# Patient Record
Sex: Female | Born: 1990 | Race: White | Hispanic: No | Marital: Single | Smoking: Former smoker
Health system: Southern US, Community
[De-identification: ages and names within clinical notes are randomized; demographics above are authoritative.]

## PROBLEM LIST (undated history)

## (undated) DIAGNOSIS — G40909 Epilepsy, unspecified, not intractable, without status epilepticus: Secondary | ICD-10-CM

## (undated) HISTORY — PX: ADENOIDECTOMY: SUR15

---

## 2016-05-06 ENCOUNTER — Ambulatory Visit
Admission: EM | Admit: 2016-05-06 | Discharge: 2016-05-06 | Disposition: A | Discharge status: Home / Self Care | Payer: 59 | Attending: Family Medicine | Admitting: Family Medicine

## 2016-05-06 ENCOUNTER — Encounter: Payer: Self-pay | Admitting: Emergency Medicine

## 2016-05-06 DIAGNOSIS — R079 Chest pain, unspecified: Secondary | ICD-10-CM | POA: Diagnosis present

## 2016-05-06 DIAGNOSIS — Z79899 Other long term (current) drug therapy: Secondary | ICD-10-CM | POA: Diagnosis not present

## 2016-05-06 DIAGNOSIS — M940 Chondrocostal junction syndrome [Tietze]: Secondary | ICD-10-CM

## 2016-05-06 DIAGNOSIS — R0789 Other chest pain: Secondary | ICD-10-CM | POA: Diagnosis not present

## 2016-05-06 HISTORY — DX: Epilepsy, unspecified, not intractable, without status epilepticus: G40.909

## 2016-05-06 MED ORDER — CYCLOBENZAPRINE HCL 10 MG PO TABS: ORAL_TABLET | ORAL | 0 refills | Status: DC

## 2016-05-06 NOTE — ED Provider Notes (Signed)
[facility]    CSN: [id number] Arrival date & time: 05/06/16  1750     History   Chief Complaint Chief Complaint  Patient presents with  . Chest Pain    HPI Jocelyn Reed is a 25 y.o. female.   25 yo female a c/o dull and sharp chest pains to the left upper chest, worse with position changes and occasionally with deep breaths. Denies any shortness of breath, cough, fevers, chills. Otherwise healthy. States last year she had a work up by a cardiologist in [place] and tests were normal.    The history is provided by the patient.  Chest Pain    Past Medical History:  Diagnosis Date  . Epilepsy (HCC)     There are no active problems to display for this patient.   Past Surgical History:  Procedure Laterality Date  . ADENOIDECTOMY      OB History    No data available       Home Medications    Prior to Admission medications   Medication Sig Start Date End Date Taking? Authorizing Provider  levonorgestrel (MIRENA) 20 MCG/24HR IUD 1 each by Intrauterine route once.   Yes Historical Provider, MD  cyclobenzaprine (FLEXERIL) 10 MG tablet 1 tab po qhs prn 05/06/16   Payton Mccallumrlando Adamarys Shall, MD    Family History History reviewed. No pertinent family history.  Social History Social History  Substance Use Topics  . Smoking status: Never Smoker  . Smokeless tobacco: Never Used  . Alcohol use Yes     Allergies   Patient has no known allergies.   Review of Systems Review of Systems  Cardiovascular: Positive for chest pain.     Physical Exam Triage Vital Signs ED Triage Vitals  Enc Vitals Group     BP 05/06/16 1807 102/64     Pulse Rate 05/06/16 1807 81     Resp 05/06/16 1807 16     Temp 05/06/16 1807 97.2 F (36.2 C)     Temp Source 05/06/16 1807 Tympanic     SpO2 05/06/16 1807 100 %     Weight 05/06/16 1804 120 lb (54.4 kg)     Height 05/06/16 1804 5\' 2"  (1.575 m)     Head Circumference --      Peak Flow --      Pain Score 05/06/16 1806 5   Pain Loc --      Pain Edu? --      Excl. in GC? --    No data found.   Updated Vital Signs BP 102/64 (BP Location: Left Arm)   Pulse 81   Temp 97.2 F (36.2 C) (Tympanic)   Resp 16   Ht 5\' 2"  (1.575 m)   Wt 120 lb (54.4 kg)   SpO2 100%   BMI 21.95 kg/m   Visual Acuity Right Eye Distance:   Left Eye Distance:   Bilateral Distance:    Right Eye Near:   Left Eye Near:    Bilateral Near:     Physical Exam  Constitutional: She appears well-developed and well-nourished. No distress.  HENT:  Head: Normocephalic and atraumatic.  Right Ear: Tympanic membrane, external ear and ear canal normal.  Left Ear: Tympanic membrane, external ear and ear canal normal.  Nose: No mucosal edema, rhinorrhea, nose lacerations, sinus tenderness, nasal deformity, septal deviation or nasal septal hematoma. No epistaxis.  No foreign bodies. Right sinus exhibits no maxillary sinus tenderness and no frontal sinus tenderness. Left sinus exhibits no maxillary sinus  tenderness and no frontal sinus tenderness.  Mouth/Throat: Uvula is midline, oropharynx is clear and moist and mucous membranes are normal. No oropharyngeal exudate.  Eyes: Conjunctivae and EOM are normal. Pupils are equal, round, and reactive to light. Right eye exhibits no discharge. Left eye exhibits no discharge. No scleral icterus.  Neck: Normal range of motion. Neck supple. No thyromegaly present.  Cardiovascular: Normal rate, regular rhythm and normal heart sounds.   Pulmonary/Chest: Effort normal and breath sounds normal. No respiratory distress. She has no wheezes. She has no rales. She exhibits tenderness (reproducible left upper chest ).  Lymphadenopathy:    She has no cervical adenopathy.  Skin: She is not diaphoretic.  Nursing note and vitals reviewed.    UC Treatments / Results  Labs (all labs ordered are listed, but only abnormal results are displayed) Labs Reviewed - No data to display  EKG  EKG Interpretation None         Radiology No results found.  Procedures .EKG Date/Time: 05/06/2016 7:38 PM Performed by: Payton MccallumONTY, Jabre Heo Authorized by: Payton MccallumONTY, Seann Genther   ECG reviewed by ED Physician in the absence of a cardiologist: yes   Previous ECG:    Previous ECG:  Unavailable Interpretation:    Interpretation: normal   Rate:    ECG rate assessment: normal   Rhythm:    Rhythm: sinus rhythm   Ectopy:    Ectopy: none   QRS:    QRS axis:  Normal Conduction:    Conduction: normal   ST segments:    ST segments:  Normal T waves:    T waves: normal     (including critical care time)  Medications Ordered in UC Medications - No data to display   Initial Impression / Assessment and Plan / UC Course  I have reviewed the triage vital signs and the nursing notes.  Pertinent labs & imaging results that were available during my care of the patient were reviewed by me and considered in my medical decision making (see chart for details).  Clinical Course       Final Clinical Impressions(s) / UC Diagnoses   Final diagnoses:  Chest wall pain  Costochondritis, acute    New Prescriptions Discharge Medication List as of 05/06/2016  7:34 PM    START taking these medications   Details  cyclobenzaprine (FLEXERIL) 10 MG tablet 1 tab po qhs prn, Normal        1. ekg results and diagnosis reviewed with patient 2. rx as per orders above; reviewed possible side effects, interactions, risks and benefits  3. Recommend supportive treatment with otc analgesics prn, heat/ice 4. Follow-up prn if symptoms worsen or don't improve   Payton Mccallumrlando Joshlynn Alfonzo, MD 05/06/16 1941

## 2016-05-06 NOTE — ED Triage Notes (Signed)
Patient was seen by a Cardiologist last year for chest pain.  Patient states that she lost her insurance and never went back to follow-up with her Cardiologist.  Patient c/o chest pain that started today.  Patient reports SOB.

## 2016-05-09 ENCOUNTER — Telehealth: Payer: Self-pay

## 2016-05-09 NOTE — Telephone Encounter (Signed)
Courtesy call back completed today for patients recent visit at [facility]. Patient did not answer, left message on voicemail to call back with any questions or concerns.   

## 2016-06-24 ENCOUNTER — Ambulatory Visit
Admission: EM | Admit: 2016-06-24 | Discharge: 2016-06-24 | Disposition: A | Discharge status: Home / Self Care | Payer: 59 | Attending: Family Medicine | Admitting: Family Medicine

## 2016-06-24 ENCOUNTER — Encounter: Payer: Self-pay | Admitting: Emergency Medicine

## 2016-06-24 DIAGNOSIS — R071 Chest pain on breathing: Secondary | ICD-10-CM

## 2016-06-24 DIAGNOSIS — R0789 Other chest pain: Secondary | ICD-10-CM

## 2016-06-24 MED ORDER — NAPROXEN 500 MG PO TABS: 500.0000 mg | ORAL_TABLET | Freq: Two times a day (BID) | ORAL | 0 refills | Status: AC

## 2016-06-24 MED ORDER — TIZANIDINE HCL 4 MG PO CAPS: 4.0000 mg | ORAL_CAPSULE | Freq: Three times a day (TID) | ORAL | 0 refills | Status: AC

## 2016-06-24 NOTE — ED Triage Notes (Signed)
Patient c/o chest pain that started 4 days ago.  Patient states it feels that same as when she was last here in December.  Patient denies SOB.  Patient reports increase in pain when she moves or lies down.

## 2016-06-24 NOTE — ED Provider Notes (Signed)
CSN: [id number]     Arrival date & time 06/24/16  1942 History   None    Chief Complaint  Patient presents with  . Chest Pain   (Consider location/radiation/quality/duration/timing/severity/associated sxs/prior Treatment) HPI  This a 26 year old female presents with recurrent anterior chest wall pain on the left. Is been seen here before by Dr. Izell Carolinaonti who diagnosed her with costochondritis scribed Flexeril which helped her pain. She states that she had to take 2 in order for it to work properly. To rule out any heart abnormalities she was seen by cardiologist who assured her that it was not her heart. She describes wearing a Holter monitor as well as echocardiogram. 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 He states that the pain will come and go and occur at different times and activities and has no real rhyme or reason.      Past Medical History:  Diagnosis Date  . Epilepsy [facility](HCC)    Past Surgical History:  Procedure Laterality Date  . ADENOIDECTOMY     History reviewed. No pertinent family history. Social History  Substance Use Topics  . Smoking status: Never Smoker  . Smokeless tobacco: Never Used  . Alcohol use Yes   OB History    No data available     Review of Systems  Constitutional: Positive for activity change. Negative for chills, fatigue and fever.  Cardiovascular: Positive for chest pain.  Musculoskeletal: Positive for myalgias.  All other systems reviewed and are negative.   Allergies  Patient has no known allergies.  Home Medications   Prior to Admission medications   Medication Sig Start Date End Date Taking? Authorizing Provider  levonorgestrel (MIRENA) 20 MCG/24HR IUD 1 each by Intrauterine route once.    Historical Provider, MD  naproxen (NAPROSYN) 500 MG tablet Take 1 tablet (500 mg total) by mouth 2 (two) times daily  with a meal. 06/24/16   Lutricia FeilWilliam P Xaidyn Kepner, PA-C  tiZANidine (ZANAFLEX) 4 MG capsule Take 1 capsule (4 mg total) by mouth 3 (three) times daily. 06/24/16   Lutricia FeilWilliam P Nigel Ericsson, PA-C   Meds Ordered and Administered this Visit  Medications - No data to display  BP 101/73 (BP Location: Left Arm)   Pulse 77   Temp 98.2 F (36.8 C) (Oral)   Resp 16   Ht 5\' 3"  (1.6 m)   Wt 125 lb (56.7 kg)   SpO2 98%   BMI 22.14 kg/m  No data found.   Physical Exam  Constitutional: She appears well-developed and well-nourished. No distress.  HENT:  Head: Normocephalic and atraumatic.  Eyes: EOM are normal. Pupils are equal, round, and reactive to light. Right eye exhibits no discharge. Left eye exhibits no discharge.  Neck: Normal range of motion. Neck supple.  Cardiovascular: Normal rate, regular rhythm and normal heart sounds.  Exam reveals no gallop and no friction rub.   No murmur heard. Pulmonary/Chest: Effort normal and breath sounds normal. No respiratory distress. She has no wheezes. She has no rales. She exhibits tenderness.  Examination was performed in presence of Heather, RN as chaperone. She has some mild tenderness over the costochondral junctions 2 through 4 as well as over the mid axillary line which is referred to the anterior costochondral junctions. Is no pain with compression of the sternum.  Skin: She is not diaphoretic.  Nursing  note and vitals reviewed.   Urgent Care Course     Procedures (including critical care time)  Labs Review Labs Reviewed - No data to display  Imaging Review No results found.   Visual Acuity Review  Right Eye Distance:   Left Eye Distance:   Bilateral Distance:    Right Eye Near:   Left Eye Near:    Bilateral Near:         MDM   1. Costochondral chest pain    Discharge Medication List as of 06/24/2016  9:20 PM    START taking these medications   Details  naproxen (NAPROSYN) 500 MG tablet Take 1 tablet (500 mg total) by mouth 2 (two)  times daily with a meal., Starting Fri 06/24/2016, Normal    tiZANidine (ZANAFLEX) 4 MG capsule Take 1 capsule (4 mg total) by mouth 3 (three) times daily., Starting Fri 06/24/2016, Normal      Plan: 1. Test/x-ray results and diagnosis reviewed with patient 2. rx as per orders; risks, benefits, potential side effects reviewed with patient 3. Recommend supportive treatment with Symptom Avoidance and rest as necessary. Will prescribe Naprosyn for anti-inflammatory effect and asked her to use it continuously for 1 week and take with meals. Will  switch her to Zanaflex. She was cautioned regarding performing activities that require concentration and judgment and not driving while using the medication. she does have a an appointment with Dr. Judithann Graves her new primary care physician who she will discuss our visit today. 4. F/u prn if symptoms worsen or don't improve     Lutricia Feil, PA-C 06/24/16 2129

## 2016-07-05 ENCOUNTER — Ambulatory Visit
Admission: RE | Admit: 2016-07-05 | Discharge: 2016-07-05 | Disposition: A | Discharge status: Home / Self Care | Payer: 59 | Source: Ambulatory Visit | Attending: Internal Medicine | Admitting: Internal Medicine

## 2016-07-05 ENCOUNTER — Ambulatory Visit (INDEPENDENT_AMBULATORY_CARE_PROVIDER_SITE_OTHER): Payer: 59 | Admitting: Internal Medicine

## 2016-07-05 ENCOUNTER — Encounter: Payer: Self-pay | Admitting: Internal Medicine

## 2016-07-05 VITALS — BP 118/78 | HR 82 | Temp 97.6°F | Ht 63.0 in | Wt 123.0 lb

## 2016-07-05 DIAGNOSIS — R071 Chest pain on breathing: Secondary | ICD-10-CM

## 2016-07-05 DIAGNOSIS — R0789 Other chest pain: Secondary | ICD-10-CM

## 2016-07-05 DIAGNOSIS — B360 Pityriasis versicolor: Secondary | ICD-10-CM

## 2016-07-05 DIAGNOSIS — N399 Disorder of urinary system, unspecified: Secondary | ICD-10-CM | POA: Diagnosis not present

## 2016-07-05 LAB — POCT URINALYSIS DIPSTICK
BILIRUBIN UA: NEGATIVE
Blood, UA: NEGATIVE
GLUCOSE UA: NEGATIVE
KETONES UA: NEGATIVE
LEUKOCYTES UA: NEGATIVE
NITRITE UA: NEGATIVE
PH UA: 5
Protein, UA: NEGATIVE
Spec Grav, UA: 1.01
Urobilinogen, UA: 0.2

## 2016-07-05 MED ORDER — CELECOXIB 200 MG PO CAPS: 200.0000 mg | ORAL_CAPSULE | Freq: Two times a day (BID) | ORAL | 0 refills | Status: AC

## 2016-07-05 MED ORDER — KETOCONAZOLE 2 % EX CREA: 1.0000 "application " | TOPICAL_CREAM | Freq: Every day | CUTANEOUS | 0 refills | Status: AC

## 2016-07-05 NOTE — Progress Notes (Signed)
Date:  07/05/2016   Name:  Jocelyn Reed   DOB:  Apr 06, 1991   MRN:  [id number]   Chief Complaint: Establish Care; Chest Pain (Pt stated having pain lt side under her breast 3 years); and Fatigue Chest wall pain - seen twice in UC for this in the past month.  Prescribed nsaids and muscle relaxants.  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  Chest Pain   This is a chronic problem. The current episode started more than 1 year ago (three years ago). The problem occurs daily. The pain is present in the lateral region. The pain is mild. The quality of the pain is described as stabbing. The pain does not radiate. Pertinent negatives include no abdominal pain, back pain, cough, dizziness, fever, headaches, nausea, palpitations or shortness of breath. The pain is aggravated by nothing. She has tried NSAIDs (aleve and advil no help) for the symptoms. The treatment provided no relief. Prior workup: EKG and Monitor.  Rash  This is a new problem. The current episode started more than 1 year ago. The problem has been gradually worsening since onset. The affected locations include the abdomen. The rash is characterized by redness. Pertinent negatives include no cough, diarrhea, fatigue, fever or shortness of breath. Past treatments include nothing.  Urinary Tract Infection   This is a recurrent problem. The problem occurs every urination. Quality: no pain, only odor. The patient is experiencing no pain. Associated symptoms include chills and frequency (and urine odor). Pertinent negatives include no nausea.     Review of Systems  Constitutional: Positive for chills. Negative for fatigue and fever.  Eyes: Negative for visual disturbance.  Respiratory: Negative for apnea, cough, choking, shortness of breath and wheezing.   Cardiovascular: Positive for chest pain. Negative for palpitations and leg swelling.  Gastrointestinal: Negative for abdominal pain, diarrhea and  nausea.  Endocrine: Negative for polydipsia and polyuria.  Genitourinary: Positive for frequency (and urine odor). Negative for dysuria, menstrual problem and vaginal discharge.  Musculoskeletal: Negative for arthralgias and back pain.  Skin: Positive for rash.  Neurological: Negative for dizziness and headaches.  Hematological: Negative for adenopathy.  Psychiatric/Behavioral: Negative for sleep disturbance.    Patient Active Problem List   Diagnosis Date Noted  . Costochondral chest pain 07/05/2016    Prior to Admission medications   Medication Sig Start Date End Date Taking? Authorizing Provider  levonorgestrel (MIRENA) 20 MCG/24HR IUD 1 each by Intrauterine route once.   Yes Historical Provider, MD  naproxen (NAPROSYN) 500 MG tablet Take 1 tablet (500 mg total) by mouth 2 (two) times daily with a meal. 06/24/16  Yes Lutricia Feil, PA-C  tiZANidine (ZANAFLEX) 4 MG capsule Take 1 capsule (4 mg total) by mouth 3 (three) times daily. 06/24/16  Yes Lutricia Feil, PA-C    No Known Allergies  Past Surgical History:  Procedure Laterality Date  . ADENOIDECTOMY      Social History  Substance Use Topics  . Smoking status: Former Smoker    Packs/day: 0.25    Years: 5.00    Types: Cigarettes    Quit date: 2016  . Smokeless tobacco: Never Used  . Alcohol use Yes     Medication list has been reviewed and updated.   Physical Exam  Constitutional: She is oriented to person, place, and time. She appears well-developed and well-nourished. No distress.  HENT:  Head: Normocephalic and atraumatic.  Neck: Normal range of  motion. Neck supple.  Cardiovascular: Normal rate, regular rhythm and normal heart sounds.   Pulmonary/Chest: Effort normal and breath sounds normal. No respiratory distress. She has no decreased breath sounds. She has no wheezes. She has no rhonchi. She has no rales. Tenderness: along lateral left T4-8.  Abdominal: Soft. Bowel sounds are normal. She exhibits no  distension. There is no tenderness. There is no CVA tenderness.  Musculoskeletal: Normal range of motion. She exhibits no edema.  Neurological: She is alert and oriented to person, place, and time.  Skin: Skin is warm and dry. Rash noted. Rash is macular (scattered lesions over mid abdomen and thighs).  Psychiatric: She has a normal mood and affect. Her behavior is normal. Thought content normal.  Nursing note and vitals reviewed.   BP 118/78   Pulse 82   Temp 97.6 F (36.4 C)   Ht 5\' 3"  (1.6 m)   Wt 123 lb (55.8 kg)   SpO2 98%   BMI 21.79 kg/m   Assessment and Plan: 1. Costochondral chest pain Will try celebrex and get CXR since ex-smoker - DG Chest 2 View; Future - celecoxib (CELEBREX) 200 MG capsule; Take 1 capsule (200 mg total) by mouth 2 (two) times daily.  Dispense: 60 capsule; Refill: 0  2. Urinary disorder UA negative - POCT urinalysis dipstick  3. Tinea versicolor - ketoconazole (NIZORAL) 2 % cream; Apply 1 application topically daily. For 2 weeks  Dispense: 15 g; Refill: 0   Bari EdwardLaura Cece Milhouse, MD [facility]  07/05/2016

## 2016-08-04 ENCOUNTER — Encounter: Payer: Self-pay | Admitting: Internal Medicine

## 2016-08-04 ENCOUNTER — Ambulatory Visit (INDEPENDENT_AMBULATORY_CARE_PROVIDER_SITE_OTHER): Payer: 59 | Admitting: Internal Medicine

## 2016-08-04 VITALS — BP 104/60 | HR 72 | Temp 98.3°F | Ht 63.0 in | Wt 125.0 lb

## 2016-08-04 DIAGNOSIS — H6692 Otitis media, unspecified, left ear: Secondary | ICD-10-CM

## 2016-08-04 DIAGNOSIS — J029 Acute pharyngitis, unspecified: Secondary | ICD-10-CM

## 2016-08-04 MED ORDER — AZITHROMYCIN 250 MG PO TABS: ORAL_TABLET | ORAL | 0 refills | Status: AC

## 2016-08-04 NOTE — Patient Instructions (Signed)

## 2016-08-04 NOTE — Progress Notes (Signed)
Date:  08/04/2016   Name:  Jocelyn KohlerLegacy Reed   DOB:  1990/06/27   MRN:  [id number]   Chief Complaint: Sore Throat (Difficulty swallowing. Woke up and Rt ear is hurting too. X 2 days. ) Sore Throat   This is a new problem. The current episode started yesterday. The problem has been gradually worsening. Neither side of throat is experiencing more pain than the other. There has been no fever. Associated symptoms include ear pain, neck pain and swollen glands. Pertinent negatives include no coughing, diarrhea, ear discharge, headaches, stridor or trouble swallowing. She has had no exposure to strep or mono. She has tried nothing for the symptoms.    Review of Systems  Constitutional: Negative for chills, fatigue and fever.  HENT: Positive for ear pain and sore throat. Negative for ear discharge, sinus pain, sinus pressure and trouble swallowing.   Respiratory: Negative for cough, chest tightness, wheezing and stridor.   Cardiovascular: Negative for chest pain, palpitations and leg swelling.  Gastrointestinal: Negative for diarrhea.  Musculoskeletal: Positive for neck pain.  Neurological: Negative for dizziness and headaches.    Patient Active Problem List   Diagnosis Date Noted  . Costochondral chest pain 07/05/2016    Prior to Admission medications   Medication Sig Start Date End Date Taking? Authorizing Provider  celecoxib (CELEBREX) 200 MG capsule Take 1 capsule (200 mg total) by mouth 2 (two) times daily. 07/05/16  Yes Reubin MilanLaura H Sena Hoopingarner, MD  ketoconazole (NIZORAL) 2 % cream Apply 1 application topically daily. For 2 weeks 07/05/16  Yes Reubin MilanLaura H Lenice Koper, MD  levonorgestrel [facility](MIRENA) 20 MCG/24HR IUD 1 each by Intrauterine route once.   Yes Historical Provider, MD  tiZANidine (ZANAFLEX) 4 MG capsule Take 1 capsule (4 mg total) by mouth 3 (three) times daily. 06/24/16  Yes Lutricia FeilWilliam P Roemer, PA-C  naproxen (NAPROSYN) 500 MG tablet Take 1 tablet (500 mg total) by mouth 2 (two) times daily with a  meal. Patient not taking: Reported on 08/04/2016 06/24/16   Lutricia FeilWilliam P Roemer, PA-C    No Known Allergies  Past Surgical History:  Procedure Laterality Date  . ADENOIDECTOMY      Social History  Substance Use Topics  . Smoking status: Former Smoker    Packs/day: 0.25    Years: 5.00    Types: Cigarettes    Quit date: 2016  . Smokeless tobacco: Never Used  . Alcohol use Yes     Medication list has been reviewed and updated.   Physical Exam  Constitutional: She is oriented to person, place, and time. She appears well-developed. No distress.  HENT:  Head: Normocephalic and atraumatic.  Right Ear: Tympanic membrane is perforated.  Left Ear: Tympanic membrane is perforated, erythematous and retracted.  Nose: Right sinus exhibits no maxillary sinus tenderness and no frontal sinus tenderness. Left sinus exhibits no maxillary sinus tenderness and no frontal sinus tenderness.  Mouth/Throat: Posterior oropharyngeal erythema present.  Tonsils very enlarged and erythematous  Neck: Normal range of motion.  Cardiovascular: Normal rate, regular rhythm and normal heart sounds.   Pulmonary/Chest: Effort normal and breath sounds normal. No respiratory distress. She has no wheezes.  Musculoskeletal: Normal range of motion.  Neurological: She is alert and oriented to person, place, and time.  Skin: Skin is warm and dry. No rash noted.  Psychiatric: She has a normal mood and affect. Her behavior is normal. Thought content normal.  Nursing note and vitals reviewed.   BP 104/60   Pulse 72  Temp 98.3 F (36.8 C)   Ht 5\' 3"  (1.6 m)   Wt 125 lb (56.7 kg)   LMP 07/12/2016 (Within Weeks)   SpO2 97%   BMI 22.14 kg/m   Assessment and Plan: 1. Pharyngitis, unspecified etiology Advil or Tylenol every 6-8 hours as needed for pain  2. Otitis of left ear Avoid over the counter ear drops of H2O2   Meds ordered this encounter  Medications  . azithromycin (ZITHROMAX Z-PAK) 250 MG tablet     Sig: UAD    Dispense:  6 each    Refill:  0    Bari Edward, MD [facility]  08/04/2016

## 2016-09-01 ENCOUNTER — Encounter: Payer: Self-pay | Admitting: Internal Medicine

## 2016-09-01 ENCOUNTER — Other Ambulatory Visit: Payer: Self-pay | Admitting: Internal Medicine

## 2016-09-01 ENCOUNTER — Telehealth: Payer: Self-pay

## 2016-09-01 DIAGNOSIS — J4520 Mild intermittent asthma, uncomplicated: Secondary | ICD-10-CM | POA: Insufficient documentation

## 2016-09-01 MED ORDER — ALBUTEROL SULFATE HFA 108 (90 BASE) MCG/ACT IN AERS: 2.0000 | INHALATION_SPRAY | Freq: Four times a day (QID) | RESPIRATORY_TRACT | 0 refills | Status: AC | PRN

## 2016-09-01 NOTE — Telephone Encounter (Signed)
Pt calling requesting for inhaler to be sent into pharmacy for wheezing and SOB while playing softball. Told she was diagnosed with asthma when she was younger and is having difficulty while running during softball to catch her breathe. She didn't want appt for this because of $20 co-pay. Advise?

## 2016-09-01 NOTE — Telephone Encounter (Signed)
Albuterol MDI sent to pharmacy.  If she continues to have sx despite the inhaler, she will need to be seen.

## 2016-09-05 NOTE — Telephone Encounter (Signed)
Tried contacting pt to inform- pt " mailbox is full and cannot accept messages at this time."

## 2017-09-30 IMAGING — CR DG CHEST 2V
2 series · 2 of 2 positions shown · non-contrast
Comparison: None.

CLINICAL DATA: Chronic left lateral chest wall pain. Initial
encounter.

EXAM:
CHEST  2 VIEW

[chest pa]
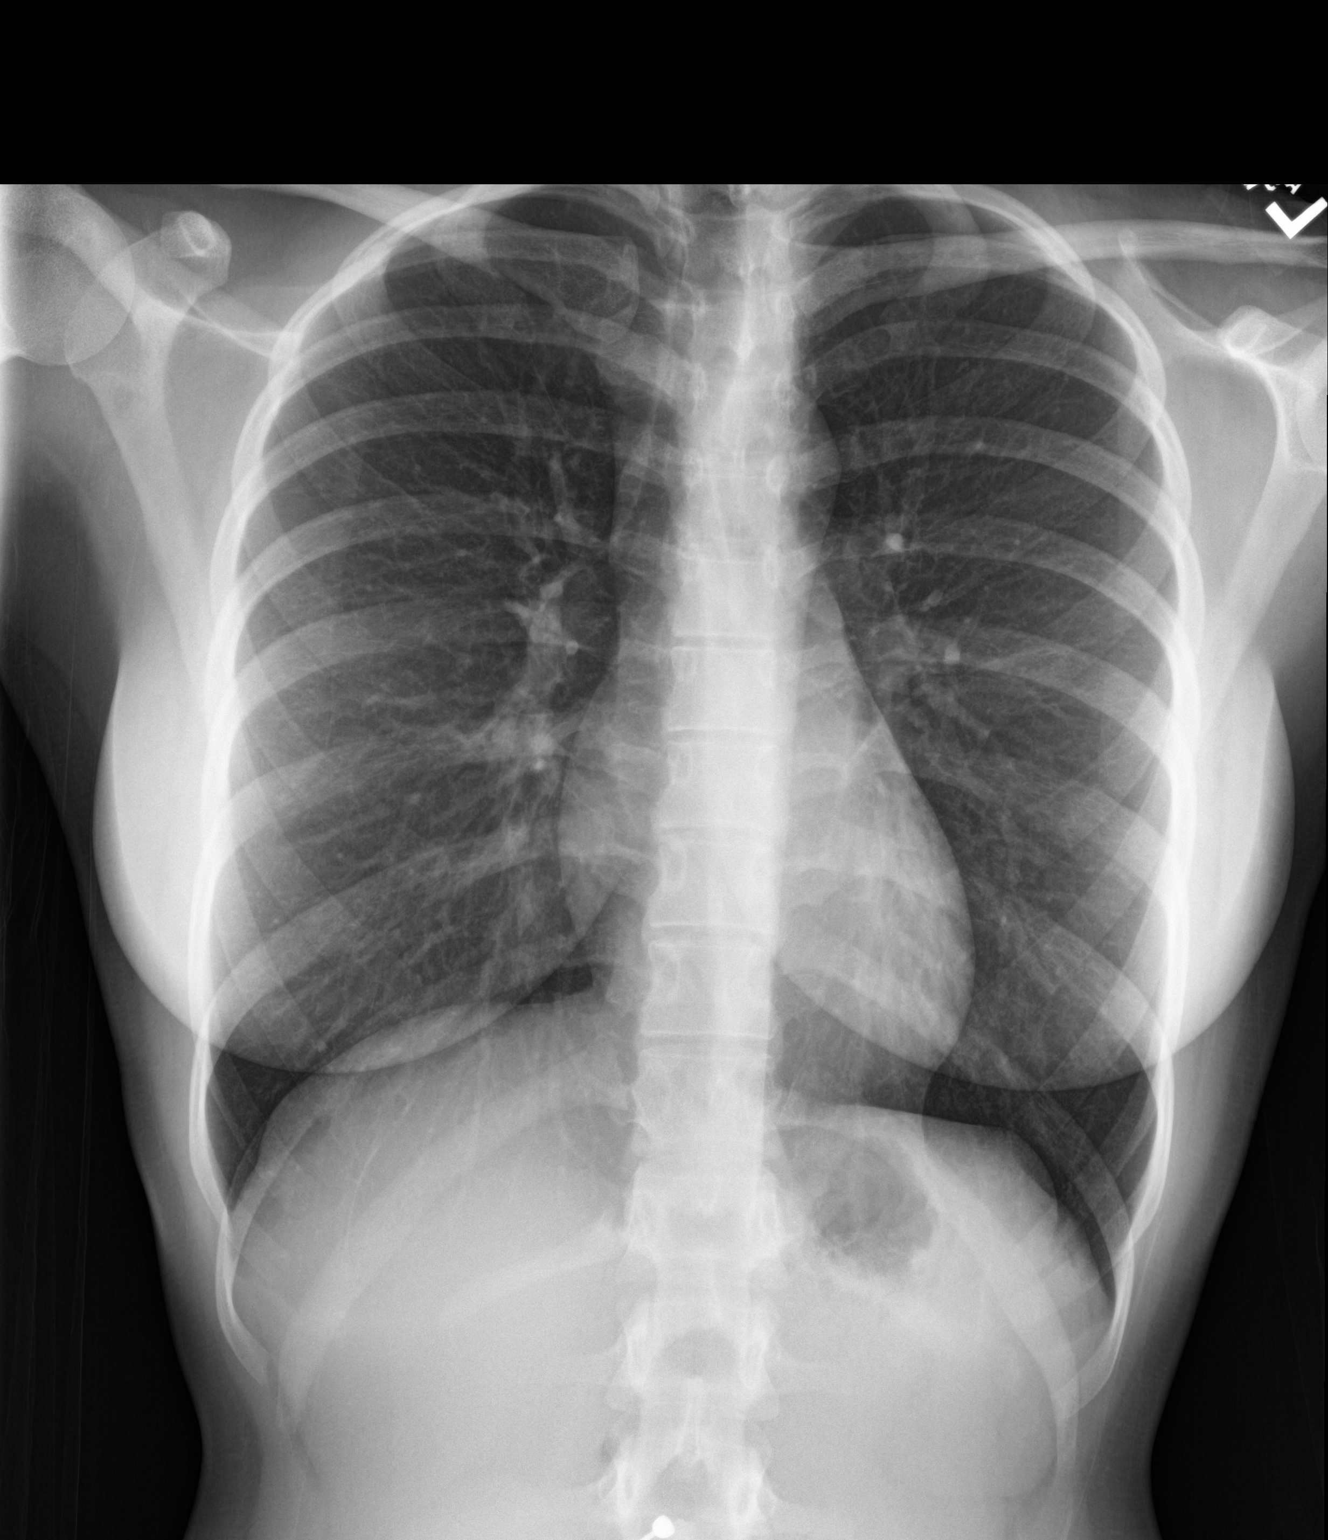

[chest lat]
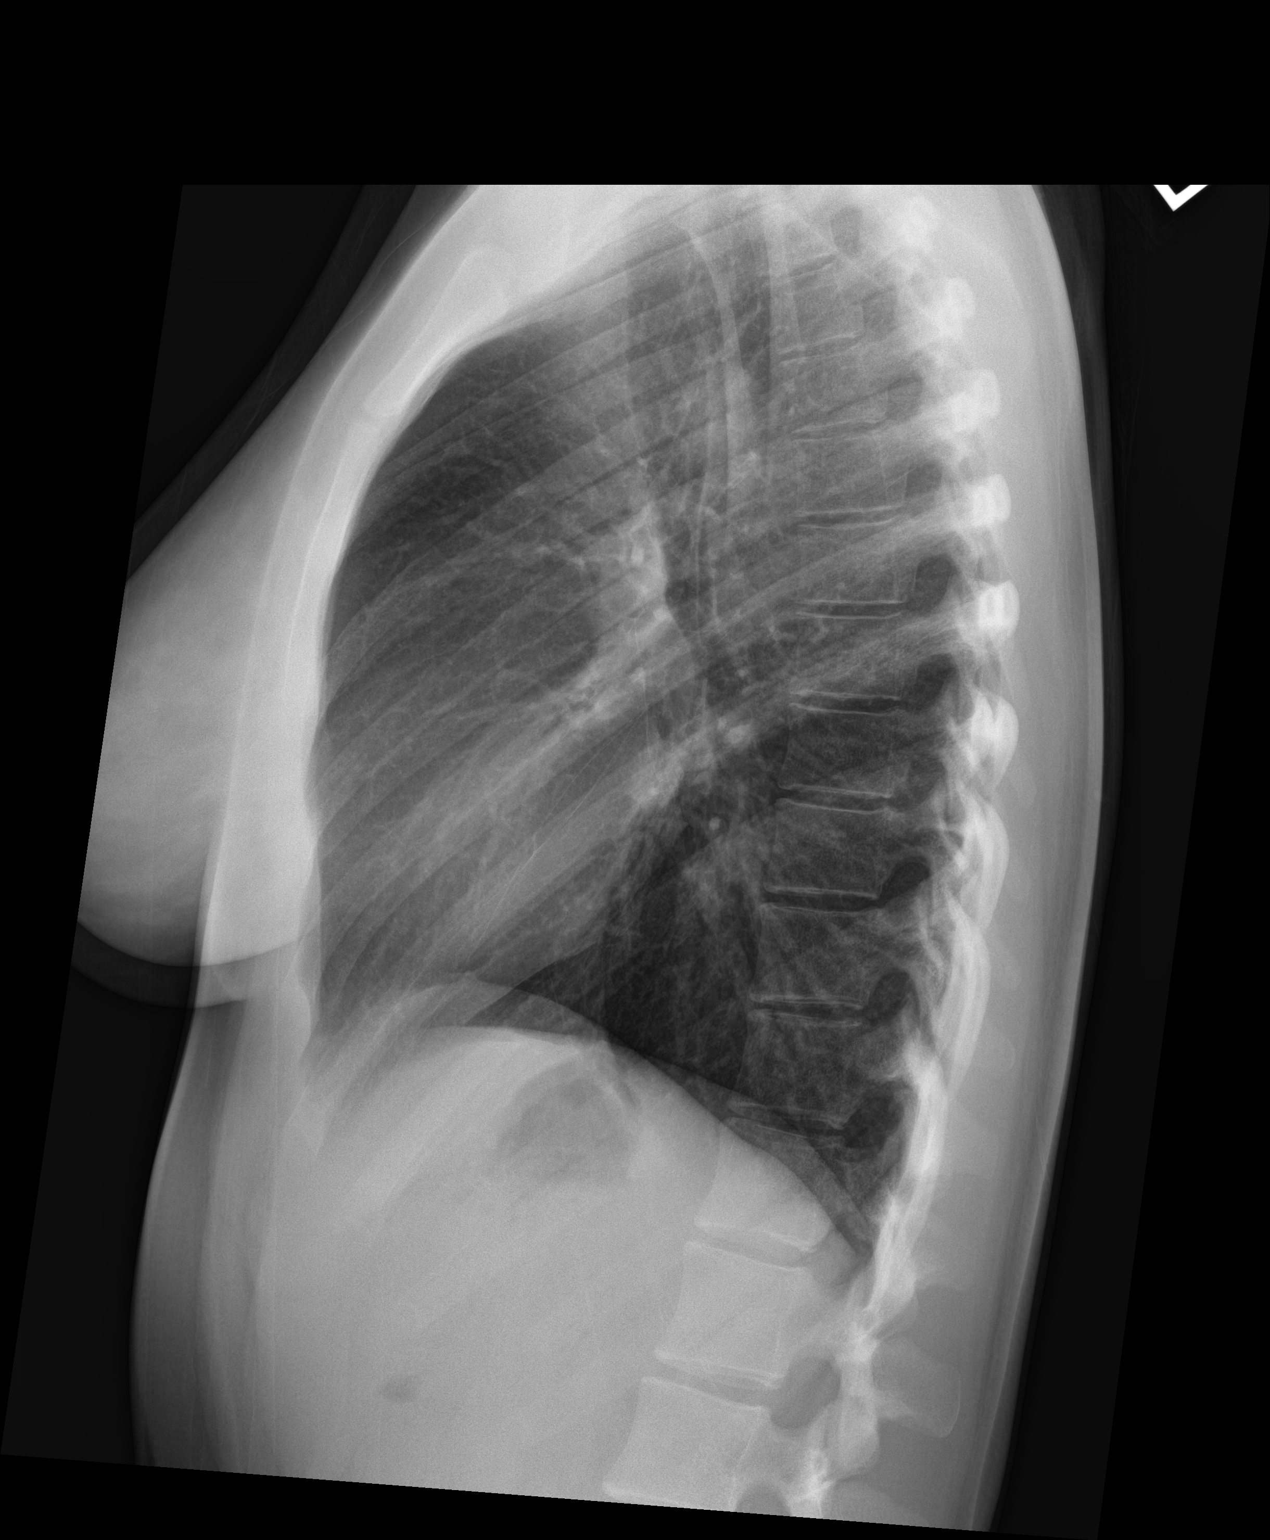

[2 of 2 positions shown; findings below may reference images not displayed]

FINDINGS: The lungs are well-aerated and clear. There is no evidence of focal
opacification, pleural effusion or pneumothorax.

The heart is normal in size; the mediastinal contour is within
normal limits. No acute osseous abnormalities are seen.
IMPRESSION: No acute cardiopulmonary process seen. No displaced rib fractures
identified.
